# Patient Record
Sex: Male | Born: 1990 | Race: Black or African American | Hispanic: No | Marital: Single | Smoking: Current some day smoker
Health system: Southern US, Community
[De-identification: ages and names within clinical notes are randomized; demographics above are authoritative.]

## PROBLEM LIST (undated history)

## (undated) HISTORY — PX: WRIST SURGERY: SHX841

## (undated) HISTORY — PX: WISDOM TOOTH EXTRACTION: SHX21

---
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
## 2008-09-18 ENCOUNTER — Emergency Department (HOSPITAL_COMMUNITY): Admission: EM | Admit: 2008-09-18 | Discharge: 2008-09-18 | Payer: Self-pay | Admitting: Emergency Medicine

## 2009-04-03 ENCOUNTER — Emergency Department (HOSPITAL_COMMUNITY): Admission: EM | Admit: 2009-04-03 | Discharge: 2009-04-04 | Payer: Self-pay | Admitting: Emergency Medicine

## 2011-02-12 ENCOUNTER — Emergency Department (HOSPITAL_COMMUNITY)
Admission: EM | Admit: 2011-02-12 | Discharge: 2011-02-12 | Disposition: A | Discharge status: Home / Self Care | Attending: Emergency Medicine | Admitting: Emergency Medicine

## 2011-02-12 DIAGNOSIS — J029 Acute pharyngitis, unspecified: Secondary | ICD-10-CM | POA: Insufficient documentation

## 2011-02-12 DIAGNOSIS — R599 Enlarged lymph nodes, unspecified: Secondary | ICD-10-CM | POA: Insufficient documentation

## 2011-02-12 LAB — RAPID STREP SCREEN (MED CTR MEBANE ONLY): Streptococcus, Group A Screen (Direct): NEGATIVE

## 2011-04-04 ENCOUNTER — Inpatient Hospital Stay (INDEPENDENT_AMBULATORY_CARE_PROVIDER_SITE_OTHER)
Admission: RE | Admit: 2011-04-04 | Discharge: 2011-04-04 | Disposition: A | Discharge status: Home / Self Care | Source: Ambulatory Visit | Attending: Family Medicine | Admitting: Family Medicine

## 2011-04-04 DIAGNOSIS — IMO0002 Reserved for concepts with insufficient information to code with codable children: Secondary | ICD-10-CM

## 2014-02-21 ENCOUNTER — Emergency Department (HOSPITAL_COMMUNITY)
Admission: EM | Admit: 2014-02-21 | Discharge: 2014-02-21 | Disposition: A | Discharge status: Home / Self Care | Attending: Emergency Medicine | Admitting: Emergency Medicine

## 2014-02-21 ENCOUNTER — Emergency Department (HOSPITAL_COMMUNITY)

## 2014-02-21 ENCOUNTER — Encounter (HOSPITAL_COMMUNITY): Payer: Self-pay | Admitting: Emergency Medicine

## 2014-02-21 DIAGNOSIS — S6980XA Other specified injuries of unspecified wrist, hand and finger(s), initial encounter: Secondary | ICD-10-CM | POA: Insufficient documentation

## 2014-02-21 DIAGNOSIS — Y9239 Other specified sports and athletic area as the place of occurrence of the external cause: Secondary | ICD-10-CM | POA: Insufficient documentation

## 2014-02-21 DIAGNOSIS — F17200 Nicotine dependence, unspecified, uncomplicated: Secondary | ICD-10-CM | POA: Insufficient documentation

## 2014-02-21 DIAGNOSIS — Y92838 Other recreation area as the place of occurrence of the external cause: Secondary | ICD-10-CM

## 2014-02-21 DIAGNOSIS — S6990XA Unspecified injury of unspecified wrist, hand and finger(s), initial encounter: Secondary | ICD-10-CM | POA: Insufficient documentation

## 2014-02-21 DIAGNOSIS — S61217A Laceration without foreign body of left little finger without damage to nail, initial encounter: Secondary | ICD-10-CM

## 2014-02-21 DIAGNOSIS — S63279A Dislocation of unspecified interphalangeal joint of unspecified finger, initial encounter: Secondary | ICD-10-CM | POA: Insufficient documentation

## 2014-02-21 DIAGNOSIS — S63257A Unspecified dislocation of left little finger, initial encounter: Secondary | ICD-10-CM

## 2014-02-21 DIAGNOSIS — Y9367 Activity, basketball: Secondary | ICD-10-CM | POA: Insufficient documentation

## 2014-02-21 DIAGNOSIS — S61209A Unspecified open wound of unspecified finger without damage to nail, initial encounter: Secondary | ICD-10-CM | POA: Insufficient documentation

## 2014-02-21 DIAGNOSIS — W1809XA Striking against other object with subsequent fall, initial encounter: Secondary | ICD-10-CM | POA: Insufficient documentation

## 2014-02-21 MED ORDER — SULFAMETHOXAZOLE-TRIMETHOPRIM 800-160 MG PO TABS: 1.0000 | ORAL_TABLET | Freq: Two times a day (BID) | ORAL | Status: DC

## 2014-02-21 MED ORDER — OXYCODONE-ACETAMINOPHEN 5-325 MG PO TABS
1.0000 | ORAL_TABLET | Freq: Once | ORAL | Status: AC
  Administered 2014-02-21: 1 via ORAL
  Filled 2014-02-21: qty 1

## 2014-02-21 NOTE — ED Notes (Signed)
Pt. fell while playing basketball  and injured his left 5th finger this evening , presents with deformity/swelling at left proximal 5th finger with skin tear - bleeding controlled.

## 2014-02-21 NOTE — Discharge Instructions (Signed)
1. Medications: bactrim - take this as directed until it is completed, usual home medications 2. Treatment: rest, drink plenty of fluids, keep wound clean with warm soap and water, keep bandage dry, wear splint until seen by Dr. Merlyn LotKuzma 3. Follow Up: Please followup with Dr. Merlyn LotKuzma.  Call tomorrow to schedule a follow-up appointment   Sutured Wound Care Sutures are stitches that can be used to close wounds. Wound care helps prevent pain and infection.  HOME CARE INSTRUCTIONS   Rest and elevate the injured area until all the pain and swelling are gone.  Only take over-the-counter or prescription medicines for pain, discomfort, or fever as directed by your caregiver.  After 48 hours, gently wash the area with mild soap and water once a day, or as directed. Rinse off the soap. Pat the area dry with a clean towel. Do not rub the wound. This may cause bleeding.  Follow your caregiver's instructions for how often to change the bandage (dressing). Stop using a dressing after 2 days or after the wound stops draining.  If the dressing sticks, moisten it with soapy water and gently remove it.  Apply ointment on the wound as directed.  Avoid stretching a sutured wound.  Drink enough fluids to keep your urine clear or pale yellow.  Follow up with your caregiver for suture removal as directed.  Use sunscreen on your wound for the next 3 to 6 months so the scar will not darken. SEEK IMMEDIATE MEDICAL CARE IF:   Your wound becomes red, swollen, hot, or tender.  You have increasing pain in the wound.  You have a red streak that extends from the wound.  There is pus coming from the wound.  You have a fever.  You have shaking chills.  There is a bad smell coming from the wound.  You have persistent bleeding from the wound. MAKE SURE YOU:   Understand these instructions.  Will watch your condition.  Will get help right away if you are not doing well or get worse. Document Released:  08/21/2004 Document Revised: 10/06/2011 Document Reviewed: 11/17/2010 [facility] Patient Information 2015 [employer], [place]. This information is not intended to replace advice given to you by your health care provider. Make sure you discuss any questions you have with your health care provider.

## 2014-02-21 NOTE — Progress Notes (Signed)
Orthopedic Tech Progress Note Patient Details:  Justin Bennett 1991-02-25 [id number]  Ortho Devices Type of Ortho Device: Finger splint Ortho Device/Splint Interventions: Application   [name], [name] M 02/21/2014, 11:40 PM

## 2014-02-21 NOTE — ED Provider Notes (Signed)
CSN: [id number]     Arrival date & time 02/21/14  1957 History   None    This chart was scribed for non-physician practitioner, Dierdre ForthHannah Laurena Valko PA-C  working with Ward GivensIva L Knapp, MD by Arlan OrganAshley Leger, ED Scribe. This patient was seen in room TR11C/TR11C and the patient's care was started at 10:24 PM.   Chief Complaint  Patient presents with  . Finger Injury   Patient is a 23 y.o. male presenting with hand pain. The history is provided by the patient and medical records. No language interpreter was used.  Hand Pain This is a new problem. The current episode started 1 to 2 hours ago. The problem occurs constantly. The problem has not changed since onset.Nothing aggravates the symptoms. Nothing relieves the symptoms. He has tried nothing for the symptoms.    HPI Comments: Justin FarrierKelvin A Bennett is a 23 y.o. male who presents to the Emergency Department complaining of a L 5th digit injury sustained just prior to arrival around 7:50 PM. Pt states he was playing basketball when he fell and landed on his L hand. Pt has noted some deformity and swelling to the finger. He has not tried any OTC medications or any home remedies to help manage symptoms. At this time he denies any fever or chills. No numbness, loss of sensation, or weakness to the finger. No known allergies to medications. Last meal around 2 PM and small amount of Gatorade while sitting in pt room. Tetanus UTD. He has no pertinent past medical history. No other concerns this visit.   History reviewed. No pertinent past medical history. Past Surgical History  Procedure Laterality Date  . Wrist surgery     No family history on file. History  Substance Use Topics  . Smoking status: Current Some Day Smoker  . Smokeless tobacco: Not on file  . Alcohol Use: Yes    Review of Systems  Constitutional: Negative for fever and chills.  Musculoskeletal: Positive for arthralgias (L 5th digit) and joint swelling (L 5th digit).  Neurological: Negative  for weakness and numbness.      Allergies  Review of patient's allergies indicates no known allergies.  Home Medications   Prior to Admission medications   Medication Sig Start Date End Date Taking? Authorizing Provider  acetaminophen (TYLENOL) 500 MG tablet Take 500 mg by mouth every 6 (six) hours as needed for mild pain.   Yes Historical Provider, MD  sulfamethoxazole-trimethoprim (SEPTRA DS) 800-160 MG per tablet Take 1 tablet by mouth every 12 (twelve) hours. 02/21/14   Trentin Knappenberger, PA-C   Triage Vitals: BP 133/92  Pulse 97  Temp(Src) 98.7 F (37.1 C) (Oral)  Resp 20  Ht 5\' 9"  (1.753 m)  Wt 190 lb 8 oz (86.41 kg)  BMI 28.12 kg/m2  SpO2 97%   Physical Exam  Nursing note and vitals reviewed. Constitutional: He appears well-developed and well-nourished. No distress.  HENT:  Head: Normocephalic and atraumatic.  Eyes: Conjunctivae are normal.  Neck: Normal range of motion.  Cardiovascular: Normal rate, regular rhythm and intact distal pulses.   Capillary refill < 3 sec  Pulmonary/Chest: Effort normal and breath sounds normal.  Musculoskeletal: He exhibits tenderness. He exhibits no edema.  ROM: Full ROM of the   Neurological: He is alert. Coordination normal.  Sensation intact to dull and sharp with 2 point discrimination intact  Strength 0/5 in the left little finger   Skin: Skin is warm and dry. He is not diaphoretic.  No tenting of the  skin 3cm Y shaped laceration to the palmer side of the left little finger  Psychiatric: He has a normal mood and affect.    ED Course  Reduction of dislocation Date/Time: 02/21/2014 11:03 PM Performed by: Dierdre Forth Authorized by: Dierdre Forth Consent: Verbal consent obtained. Risks and benefits: risks, benefits and alternatives were discussed Consent given by: patient Patient understanding: patient states understanding of the procedure being performed Patient consent: the patient's understanding of the  procedure matches consent given Procedure consent: procedure consent matches procedure scheduled Relevant documents: relevant documents present and verified Site marked: the operative site was marked Imaging studies: imaging studies available Required items: required blood products, implants, devices, and special equipment available Patient identity confirmed: verbally with patient and arm band Time out: Immediately prior to procedure a "time out" was called to verify the correct patient, procedure, equipment, support staff and site/side marked as required. Preparation: Patient was prepped and draped in the usual sterile fashion. Local anesthesia used: yes Anesthesia: digital block Local anesthetic: lidocaine 2% without epinephrine Anesthetic total: 5 ml Patient sedated: no Patient tolerance: Patient tolerated the procedure well with no immediate complications.  LACERATION REPAIR Date/Time: 02/21/2014 11:03 PM Performed by: Dierdre Forth Authorized by: Dierdre Forth Consent: Verbal consent obtained. Risks and benefits: risks, benefits and alternatives were discussed Consent given by: patient Patient understanding: patient states understanding of the procedure being performed Patient consent: the patient's understanding of the procedure matches consent given Procedure consent: procedure consent matches procedure scheduled Relevant documents: relevant documents present and verified Site marked: the operative site was marked Imaging studies: imaging studies available Required items: required blood products, implants, devices, and special equipment available Patient identity confirmed: verbally with patient and arm band Time out: Immediately prior to procedure a "time out" was called to verify the correct patient, procedure, equipment, support staff and site/side marked as required. Body area: upper extremity Location details: left small finger Laceration length: 3  cm Foreign bodies: no foreign bodies Tendon involvement: none Nerve involvement: none Vascular damage: no Anesthesia: digital block Local anesthetic: lidocaine 2% without epinephrine Anesthetic total: 5 ml Patient sedated: no Preparation: Patient was prepped and draped in the usual sterile fashion. Irrigation solution: saline Irrigation method: syringe Amount of cleaning: extensive Debridement: none Degree of undermining: none Skin closure: 4-0 Prolene Number of sutures: 3 Technique: simple Approximation: loose Approximation difficulty: complex Dressing: 4x4 sterile gauze and splint Patient tolerance: Patient tolerated the procedure well with no immediate complications.   (including critical care time)  DIAGNOSTIC STUDIES: Oxygen Saturation is 97% on RA, Normal by my interpretation.    COORDINATION OF CARE: 10:55 PM- Will perform joint reduction and laceration repair. Discussed treatment plan with pt at bedside and pt agreed to plan.     Labs Review Labs Reviewed - No data to display  Imaging Review Dg Finger Little Left  02/21/2014   CLINICAL DATA:  Postreduction finger injury.  EXAM: LEFT LITTLE FINGER 2+V  COMPARISON:  02/21/2014  FINDINGS: Interval reduction of the fifth proximal interphalangeal joint. Normal alignment is demonstrated. Soft tissue swelling. No fractures identified.  IMPRESSION: Reduction of fifth proximal interphalangeal joint dislocation.   Electronically Signed   By: Burman Nieves M.D.   On: 02/21/2014 23:25   Dg Finger Little Left  02/21/2014   CLINICAL DATA:  Finger injury after a fall.  Deformity.  EXAM: LEFT LITTLE FINGER 2+V  COMPARISON:  None.  FINDINGS: Dislocation of the proximal interphalangeal joint of the left fifth finger with complete posterior  displacement of the middle phalanx with respect to the proximal phalanx and mild overriding. Suggestion of soft tissue gas. No fractures identified.  IMPRESSION: Dislocation of the proximal  interphalangeal joint of the left fifth finger.   Electronically Signed   By: Burman Nieves M.D.   On: 02/21/2014 21:58     EKG Interpretation None      MDM   Final diagnoses:  Laceration of left little finger w/o foreign body w/o damage to nail, initial encounter  Dislocation of left little finger, initial encounter   Justin Bennett presents with dislocation of that P. I P. joint of the left little finger with associated laceration after playing basketball.  X-ray with Dislocation of the proximal interphalangeal joint of the left fifth finger.  10:30PM Patient discussed with Dr. Merlyn Lot who is comfortable with reduction of dislocation and laceration repair here in the emergency department tonight. He recommends patient be placed on antibiotics and followup in the clinic this week.   11:05 PM Reduction of dislocation without difficulty. Laceration sutured loosely. Postreduction film and splint pending. Postreduction patient with full range of motion of the left little finger including 5/5 resisted flexion and extension.   11:52 PM Postreduction film with complete reduction of fifth proximal interphalangeal joint dislocation without fracture.  Tdap UTD. Pressure irrigation performed. Laceration occurred < 8 hours prior to repair which was well tolerated. Pt has no co morbidities to effect normal wound healing. Discussed suture home care w pt and answered questions. Pt to f-u for wound check and suture removal in 7 days with Dr. Merlyn Lot. Pt is hemodynamically stable w no complaints prior to dc.    BP 133/92  Pulse 97  Temp(Src) 98.7 F (37.1 C) (Oral)  Resp 20  Ht 5\' 9"  (1.753 m)  Wt 190 lb 8 oz (86.41 kg)  BMI 28.12 kg/m2  SpO2 97%  I personally performed the services described in this documentation, which was scribed in my presence. The recorded information has been reviewed and is accurate.    Dahlia Client Trusten Hume, PA-C 02/21/14 [phone]

## 2014-02-21 NOTE — ED Notes (Signed)
Pt taken back to xray for post reduction film.

## 2014-02-22 NOTE — ED Provider Notes (Signed)
Medical screening examination/treatment/procedure(s) were performed by non-physician practitioner and as supervising physician I was immediately available for consultation/collaboration.   EKG Interpretation None      Charne Mcbrien, MD, FACEP   Ada Woodbury L Jaslynne Dahan, MD 02/22/14 0026 

## 2015-09-29 IMAGING — CR DG FINGER LITTLE 2+V*L*
3 series · 3 of 3 positions shown · non-contrast
Comparison: None.

CLINICAL DATA: Finger injury after a fall.  Deformity.

EXAM:
LEFT LITTLE FINGER 2+V

[x finger pa left]
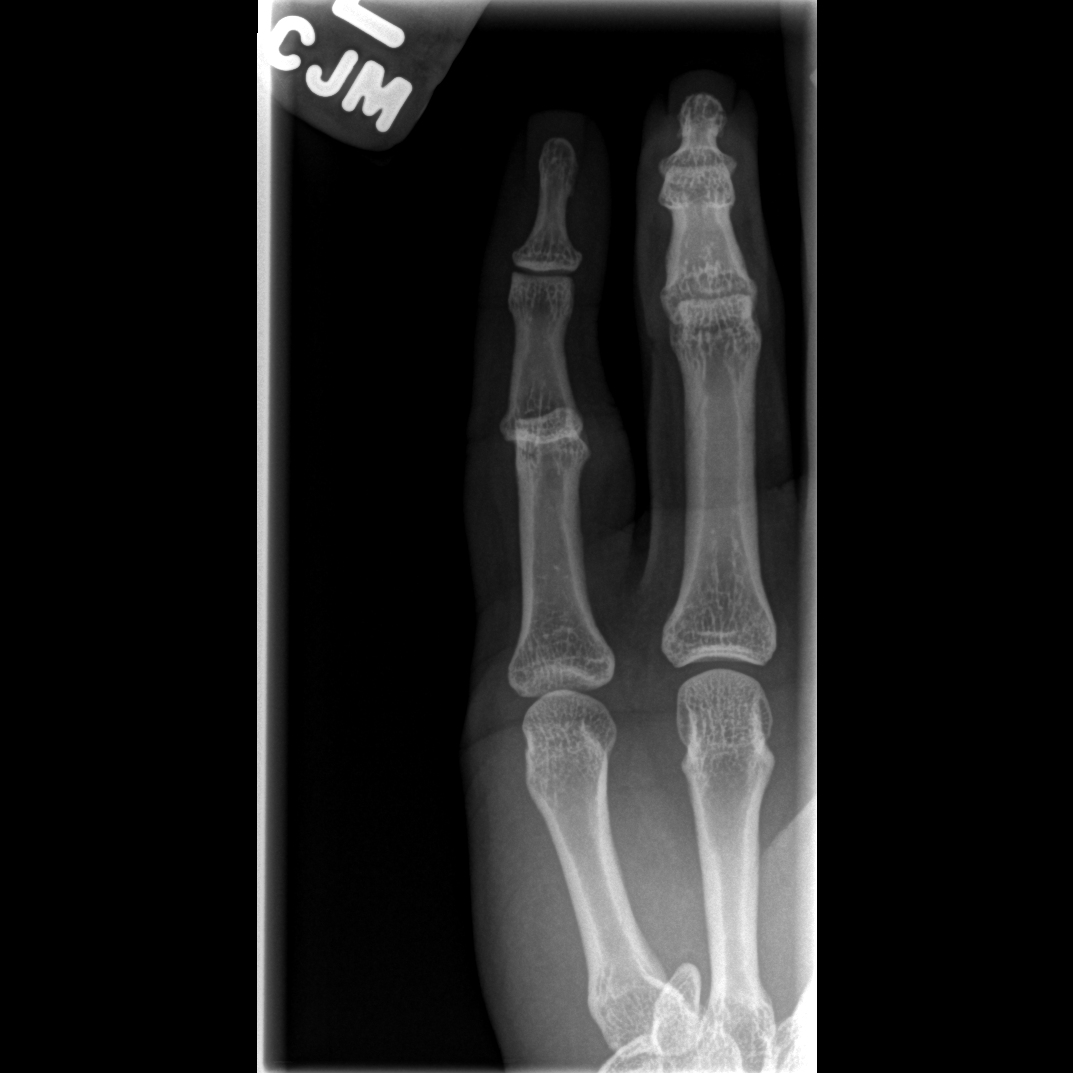

[x finger obl. left]
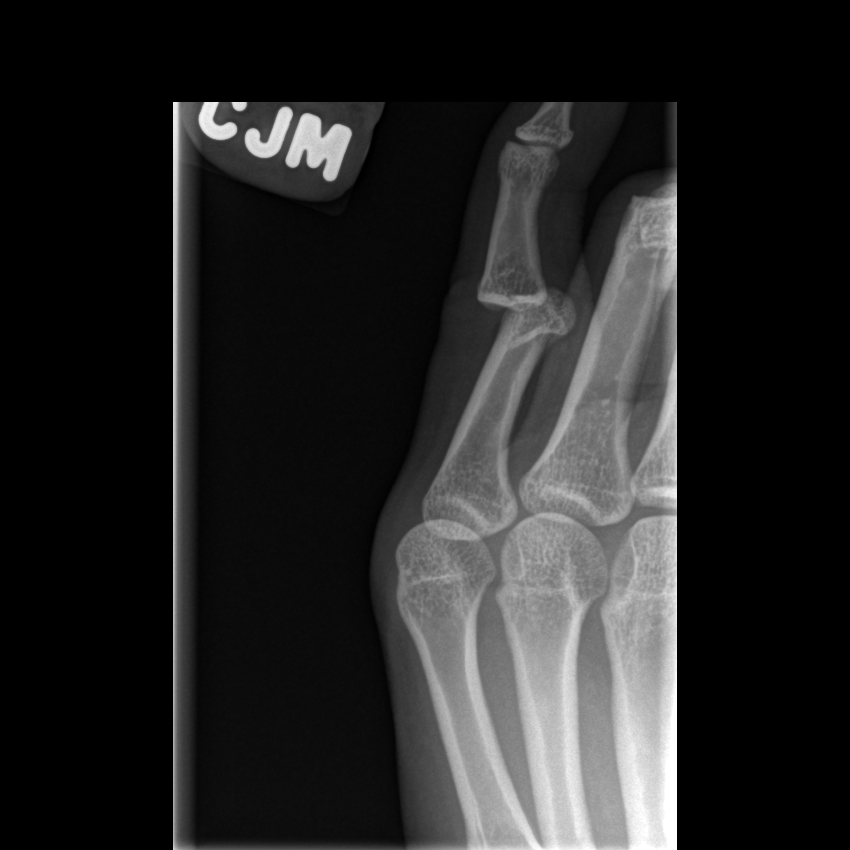

[x finger lateral left]
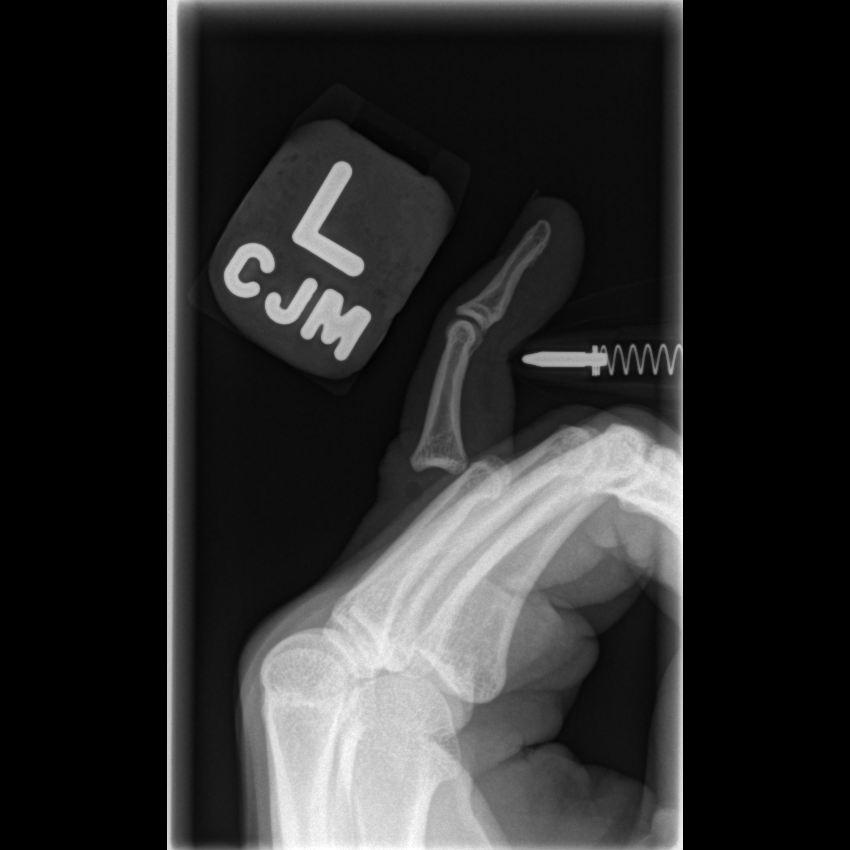

[3 of 3 positions shown; findings below may reference images not displayed]

FINDINGS: Dislocation of the proximal interphalangeal joint of the left fifth
finger with complete posterior displacement of the middle phalanx
with respect to the proximal phalanx and mild overriding. Suggestion
of soft tissue gas. No fractures identified.
IMPRESSION: Dislocation of the proximal interphalangeal joint of the left fifth
finger.

## 2016-01-31 ENCOUNTER — Emergency Department (HOSPITAL_COMMUNITY): Payer: 59

## 2016-01-31 ENCOUNTER — Emergency Department (HOSPITAL_COMMUNITY)
Admission: EM | Admit: 2016-01-31 | Discharge: 2016-01-31 | Disposition: A | Discharge status: Home / Self Care | Payer: 59 | Attending: Emergency Medicine | Admitting: Emergency Medicine

## 2016-01-31 ENCOUNTER — Encounter (HOSPITAL_COMMUNITY): Payer: Self-pay | Admitting: Emergency Medicine

## 2016-01-31 DIAGNOSIS — Y999 Unspecified external cause status: Secondary | ICD-10-CM | POA: Diagnosis not present

## 2016-01-31 DIAGNOSIS — Y939 Activity, unspecified: Secondary | ICD-10-CM | POA: Insufficient documentation

## 2016-01-31 DIAGNOSIS — F17200 Nicotine dependence, unspecified, uncomplicated: Secondary | ICD-10-CM | POA: Diagnosis not present

## 2016-01-31 DIAGNOSIS — S90212A Contusion of left great toe with damage to nail, initial encounter: Secondary | ICD-10-CM | POA: Insufficient documentation

## 2016-01-31 DIAGNOSIS — Z79899 Other long term (current) drug therapy: Secondary | ICD-10-CM | POA: Insufficient documentation

## 2016-01-31 DIAGNOSIS — Y929 Unspecified place or not applicable: Secondary | ICD-10-CM | POA: Insufficient documentation

## 2016-01-31 DIAGNOSIS — S90222A Contusion of left lesser toe(s) with damage to nail, initial encounter: Secondary | ICD-10-CM

## 2016-01-31 DIAGNOSIS — W2209XA Striking against other stationary object, initial encounter: Secondary | ICD-10-CM | POA: Diagnosis not present

## 2016-01-31 DIAGNOSIS — S90932A Unspecified superficial injury of left great toe, initial encounter: Secondary | ICD-10-CM | POA: Diagnosis present

## 2016-01-31 NOTE — ED Provider Notes (Signed)
CSN: [id number]     Arrival date & time 01/31/16  1724 History  By signing my name below, I, Phillis HaggisGabriella Gaje, attest that this documentation has been prepared under the direction and in the presence of Arthor CaptainAbigail Sayeed Weatherall, PA-C. Electronically Signed: Phillis HaggisGabriella Gaje, ED Scribe. 01/31/2016. 5:36 PM.   Chief Complaint  Patient presents with  . Toe Injury    pt dropped palet on toe   The history is provided by the patient. No language interpreter was used.  HPI Comments: Justin Bennett is a 25 y.o. male who presents to the Emergency Department complaining of a left great toe injury onset 6 hours ago. He reports associated throbbing pain, swelling, and laceration to the toe. Pt dropped a large wooden pallet on his foot, injuring it. Pt was wearing thin sneakers at the time. He has used ice on the area and taken ibuprofen to no relief. He denies rash, numbness, or weakness.  No past medical history on file. Past Surgical History  Procedure Laterality Date  . Wrist surgery     No family history on file. Social History  Substance Use Topics  . Smoking status: Current Some Day Smoker  . Smokeless tobacco: Not on file  . Alcohol Use: Yes    Review of Systems  Musculoskeletal: Positive for joint swelling and arthralgias.  Skin: Positive for wound. Negative for rash.  Neurological: Negative for weakness and numbness.   Allergies  Review of patient's allergies indicates no known allergies.  Home Medications   Prior to Admission medications   Medication Sig Start Date End Date Taking? Authorizing Provider  acetaminophen (TYLENOL) 500 MG tablet Take 500 mg by mouth every 6 (six) hours as needed for mild pain.    Historical Provider, MD  sulfamethoxazole-trimethoprim (SEPTRA DS) 800-160 MG per tablet Take 1 tablet by mouth every 12 (twelve) hours. 02/21/14   Hannah Muthersbaugh, PA-C   There were no vitals taken for this visit. Physical Exam  Constitutional: He is oriented to person, place, and  time. He appears well-developed and well-nourished.  HENT:  Head: Normocephalic and atraumatic.  Mouth/Throat: Oropharynx is clear and moist.  Eyes: Conjunctivae and EOM are normal. Pupils are equal, round, and reactive to light.  Neck: Normal range of motion. Neck supple.  Musculoskeletal: Normal range of motion.       Left foot: There is tenderness and swelling. There is no deformity.  Large subungual hematoma to the left great toe; small superficial abrasion to the base of the left great toe  Neurological: He is alert and oriented to person, place, and time.  Skin: Skin is warm and dry.  Psychiatric: He has a normal mood and affect. His behavior is normal.  Nursing note and vitals reviewed.   ED Course  .Marland Kitchen.Incision and Drainage Date/Time: 01/31/2016 7:08 PM Performed by: Arthor CaptainHARRIS, Anndee Connett Authorized by: Arthor CaptainHARRIS, Olivia Royse Consent: Verbal consent obtained. Risks and benefits: risks, benefits and alternatives were discussed Patient identity confirmed: verbally with patient and provided demographic data Type: subungual hematoma Body area: lower extremity Location details: left big toe Needle gauge: 18 Complexity: simple Drainage: bloody Drainage amount: moderate Wound treatment: wound left open   (including critical care time) DIAGNOSTIC STUDIES: Oxygen Saturation is 100% on RA, normal by my interpretation.    COORDINATION OF CARE: 6:11 PM-Discussed treatment plan which includes x-ray with pt at bedside and pt agreed to plan.    Labs Review Labs Reviewed - No data to display  Imaging Review Dg Toe Great Left  01/31/2016  CLINICAL DATA:  Initial encounter for Pt is c/o l/foot-1st toe pain. Stated that he dropped a wooden palet on top of it 6 hours ago EXAM: LEFT GREAT TOE COMPARISON:  None. FINDINGS: No acute fracture or dislocation. No definite soft tissue swelling. Mild degenerative changes involve the first metatarsal phalangeal joint. IMPRESSION: No acute osseous abnormality.  Electronically Signed   By: Jeronimo GreavesKyle  Talbot M.D.   On: 01/31/2016 18:52   I have personally reviewed and evaluated these images as part of my medical decision-making.   EKG Interpretation None      MDM   Patient X-Ray negative for obvious fracture or dislocation. Pain managed in ED. Pt advised to follow up with orthopedics if symptoms persist. Pt with subungual hematoma that will be treated with a trephination in the ED. Patient given brace while in ED, conservative therapy recommended and discussed. Patient will be dc home & is agreeable with above plan.  Final diagnoses:  Subungual hematoma of foot, left, initial encounter    I personally performed the services described in this documentation, which was scribed in my presence. The recorded information has been reviewed and is accurate.         Arthor Captainbigail Manford Sprong, PA-C 01/31/16 1909  Mancel BaleElliott Wentz, MD 02/01/16 1131

## 2016-01-31 NOTE — ED Notes (Signed)
Pt is c/o l/foot-1st toe pain. Stated that he dropped a wooden palet on top of it 6 hours ago. Iced toe at 4pm. Took Motrin 600 mg at 4pm

## 2016-01-31 NOTE — Discharge Instructions (Signed)
Subungual Hematoma A subungual hematoma is a pocket of blood that collects under the fingernail or toenail. The pressure created by the blood under the nail can cause pain. CAUSES  A subungual hematoma occurs when an injury to the finger or toe causes a blood vessel beneath the nail to break. The injury can occur from a direct blow such as slamming a finger in a door. It can also occur from a repeated injury such as pressure on the foot in a shoe while running. A subungual hematoma is sometimes called runner's toe or tennis toe. SYMPTOMS   Blue or dark blue skin under the nail.  Pain or throbbing in the injured area. DIAGNOSIS  Your caregiver can determine whether you have a subungual hematoma based on your history and a physical exam. If your caregiver thinks you might have a broken (fractured) bone, X-rays may be taken. TREATMENT  Hematomas usually go away on their own over time. Your caregiver may make a hole in the nail to drain the blood. Draining the blood is painless and usually provides significant relief from pain and throbbing. The nail usually grows back normally after this procedure. In some cases, the nail may need to be removed. This is done if there is a cut under the nail that requires stitches (sutures). HOME CARE INSTRUCTIONS   Put ice on the injured area.  Put ice in a plastic bag.  Place a towel between your skin and the bag.  Leave the ice on for 15-20 minutes, 03-04 times a day for the first 1 to 2 days.  Elevate the injured area to help decrease pain and swelling.  If you were given a bandage, wear it for as long as directed by your caregiver.  If part of your nail falls off, trim the remaining nail gently. This prevents the nail from catching on something and causing further injury.  Only take over-the-counter or prescription medicines for pain, discomfort, or fever as directed by your caregiver. SEEK IMMEDIATE MEDICAL CARE IF:   You have redness or swelling  around the nail.  You have yellowish-white fluid (pus) coming from the nail.  Your pain is not controlled with medicine.  You have a fever. MAKE SURE YOU:  Understand these instructions.  Will watch your condition.  Will get help right away if you are not doing well or get worse.   This information is not intended to replace advice given to you by your health care provider. Make sure you discuss any questions you have with your health care provider.   Document Released: 07/11/2000 Document Revised: 10/06/2011 Document Reviewed: 11/29/2014 [employer] Interactive Patient Education 2016 [employer]  

## 2017-09-07 IMAGING — CR DG TOE GREAT 2+V*L*
3 series · 3 of 3 positions shown · non-contrast
Comparison: None.

CLINICAL DATA: Initial encounter for Pt is c/o l/foot-2st toe pain.
Stated that he dropped Mayabel Tiger palet on top of it 6 hours ago

EXAM:
LEFT GREAT TOE

[x toes ap left]
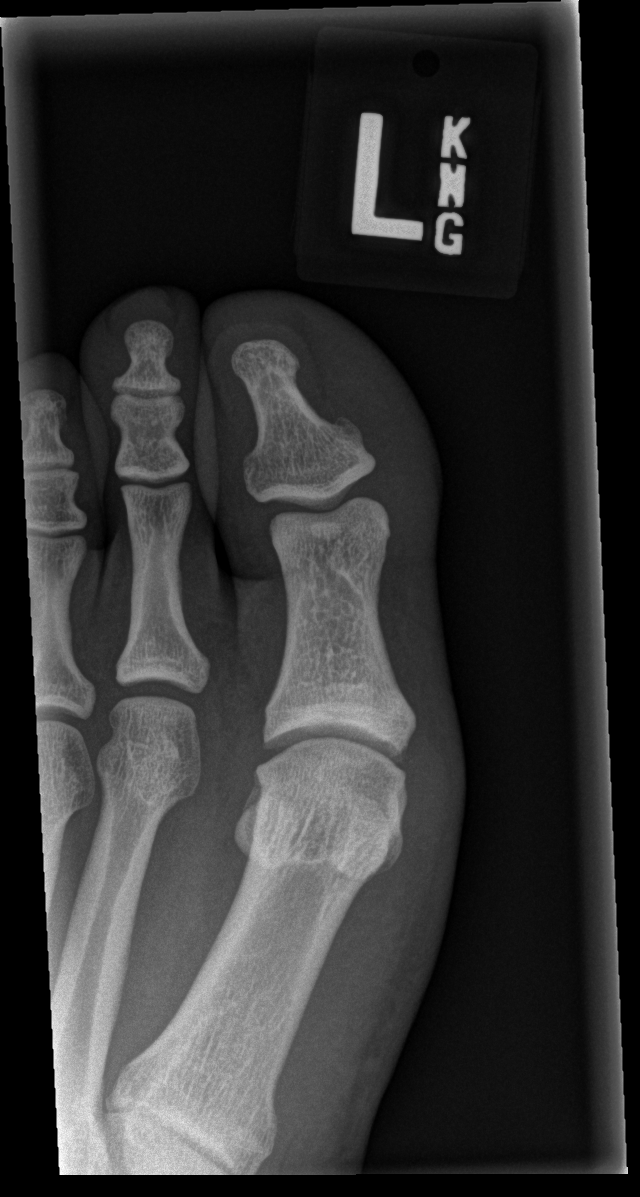

[x toes obl left]
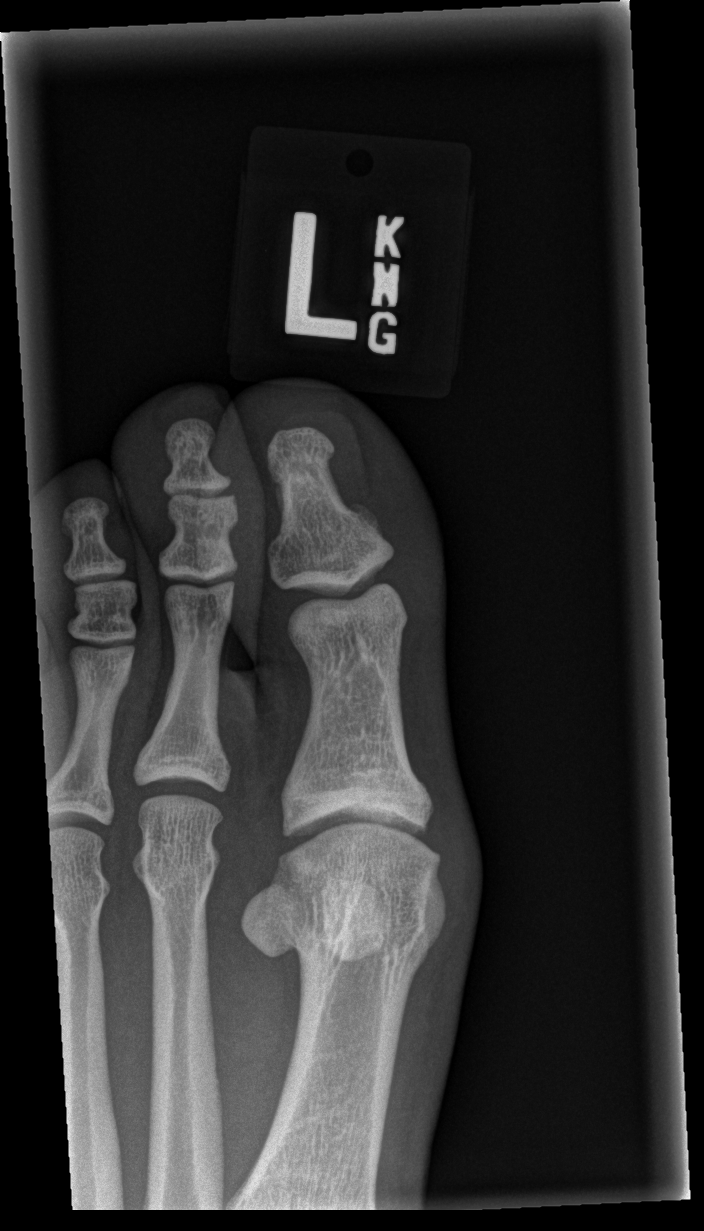

[x toes lat left]
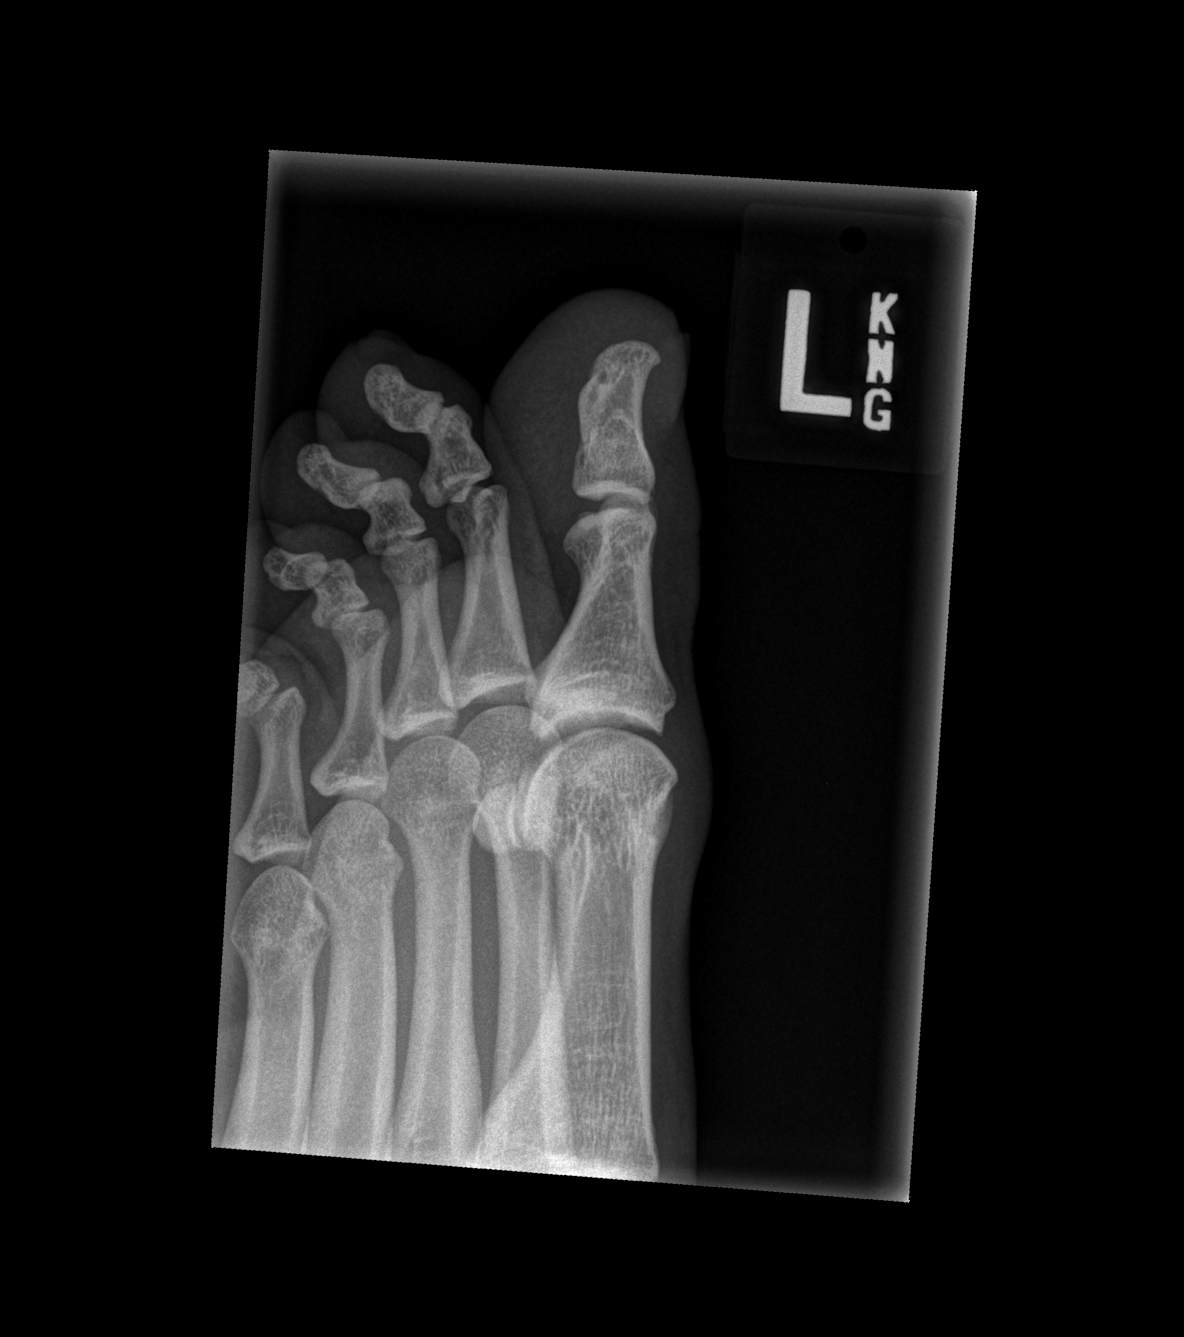

[3 of 3 positions shown; findings below may reference images not displayed]

FINDINGS: No acute fracture or dislocation. No definite soft tissue swelling.
Mild degenerative changes involve the first metatarsal phalangeal
joint.
IMPRESSION: No acute osseous abnormality.

## 2023-01-10 ENCOUNTER — Ambulatory Visit
Admission: EM | Admit: 2023-01-10 | Discharge: 2023-01-10 | Disposition: A | Discharge status: Home / Self Care | Payer: 59 | Attending: Internal Medicine | Admitting: Internal Medicine

## 2023-01-10 DIAGNOSIS — K0889 Other specified disorders of teeth and supporting structures: Secondary | ICD-10-CM | POA: Diagnosis not present

## 2023-01-10 DIAGNOSIS — K047 Periapical abscess without sinus: Secondary | ICD-10-CM | POA: Diagnosis not present

## 2023-01-10 MED ORDER — AMOXICILLIN-POT CLAVULANATE 875-125 MG PO TABS: 1.0000 | ORAL_TABLET | Freq: Two times a day (BID) | ORAL | 0 refills | Status: AC

## 2023-01-10 NOTE — ED Provider Notes (Signed)
[facility]    CSN: [id number] Arrival date & time: 01/10/23  0813      History   Chief Complaint Chief Complaint  Patient presents with   Abscess    X2 days Dental abscess    HPI Justin Bennett is a 32 y.o. male.   Patient presents with right lower dental pain and swelling that started about 2 days ago.  Patient has taken Advil for symptoms.  Denies any fever or trauma to the face.   Abscess   History reviewed. No pertinent past medical history.  There are no problems to display for this patient.   Past Surgical History:  Procedure Laterality Date   WISDOM TOOTH EXTRACTION     WRIST SURGERY     WRIST SURGERY         Home Medications    Prior to Admission medications   Medication Sig Start Date End Date Taking? Authorizing Provider  amoxicillin-clavulanate (AUGMENTIN) 875-125 MG tablet Take 1 tablet by mouth every 12 (twelve) hours. 01/10/23  Yes Wretha Laris, Rolly Salter E, FNP  acetaminophen (TYLENOL) 500 MG tablet Take 500 mg by mouth every 6 (six) hours as needed for mild pain.    [provider]    Family History History reviewed. No pertinent family history.  Social History Social History   Tobacco Use   Smoking status: Some Days  Substance Use Topics   Alcohol use: Not Currently    Comment: occ     Allergies   Patient has no known allergies.   Review of Systems Review of Systems Per HPI  Physical Exam Triage Vital Signs ED Triage Vitals  Enc Vitals Group     BP 01/10/23 0825 (!) 138/90     Pulse Rate 01/10/23 0825 73     Resp 01/10/23 0825 17     Temp 01/10/23 0825 97.6 F (36.4 C)     Temp Source 01/10/23 0825 Oral     SpO2 01/10/23 0825 96 %     Weight 01/10/23 0822 230 lb (104.3 kg)     Height 01/10/23 0822 5\' 10"  (1.778 m)     Head Circumference --      Peak Flow --      Pain Score 01/10/23 0822 7     Pain Loc --      Pain Edu? --      Excl. in GC? --    No data found.  Updated Vital Signs BP (!)  138/90 (BP Location: Left Arm)   Pulse 73   Temp 97.6 F (36.4 C) (Oral)   Resp 17   Ht 5\' 10"  (1.778 m)   Wt 230 lb (104.3 kg)   SpO2 96%   BMI 33.00 kg/m   Visual Acuity Right Eye Distance:   Left Eye Distance:   Bilateral Distance:    Right Eye Near:   Left Eye Near:    Bilateral Near:     Physical Exam Constitutional:      General: He is not in acute distress.    Appearance: Normal appearance. He is not toxic-appearing or diaphoretic.  HENT:     Head: Normocephalic and atraumatic.     Comments: Patient has right lower back gingival swelling and erythema present. Swelling noted to right outer jaw of face as well.  Eyes:     Extraocular Movements: Extraocular movements intact.     Conjunctiva/sclera: Conjunctivae normal.  Pulmonary:     Effort: Pulmonary effort is normal.  Neurological:  General: No focal deficit present.     Mental Status: He is alert and oriented to person, place, and time. Mental status is at baseline.  Psychiatric:        Mood and Affect: Mood normal.        Behavior: Behavior normal.        Thought Content: Thought content normal.        Judgment: Judgment normal.      UC Treatments / Results  Labs (all labs ordered are listed, but only abnormal results are displayed) Labs Reviewed - No data to display  EKG   Radiology No results found.  Procedures Procedures (including critical care time)  Medications Ordered in UC Medications - No data to display  Initial Impression / Assessment and Plan / UC Course  I have reviewed the triage vital signs and the nursing notes.  Pertinent labs & imaging results that were available during my care of the patient were reviewed by me and considered in my medical decision making (see chart for details).     Physical exam is concerning for dental infection.  Will treat with Augmentin.  Advised supportive care and symptom management.  Advised following up with dentist and patient was provided  with dental resources paperwork.  Advised strict follow-up precautions.  Patient verbalized understanding and was agreeable with plan. Final Clinical Impressions(s) / UC Diagnoses   Final diagnoses:  Dental infection  Pain, dental     Discharge Instructions      You have a dental infection which is being treated with an antibiotic.  Please follow-up with dentist for further evaluation and management.    ED Prescriptions     Medication Sig Dispense Auth. Provider   amoxicillin-clavulanate (AUGMENTIN) 875-125 MG tablet Take 1 tablet by mouth every 12 (twelve) hours. 14 tablet [place], Acie Fredrickson, [place]      PDMP not reviewed this encounter.   Gustavus Bryant, [place] 01/10/23 [phone]

## 2023-01-10 NOTE — Discharge Instructions (Signed)
You have a dental infection which is being treated with an antibiotic.  Please follow-up with dentist for further evaluation and management. 

## 2023-01-10 NOTE — ED Triage Notes (Signed)
Pt states that he has a dental abscess and the right side of his face is swollen. X2 days

## 2023-10-20 ENCOUNTER — Ambulatory Visit (HOSPITAL_BASED_OUTPATIENT_CLINIC_OR_DEPARTMENT_OTHER): Payer: 59 | Admitting: Family Medicine

## 2023-12-28 ENCOUNTER — Ambulatory Visit (HOSPITAL_BASED_OUTPATIENT_CLINIC_OR_DEPARTMENT_OTHER): Admitting: Family Medicine
